# Patient Record
Sex: Male | Born: 1940 | Race: Asian | Hispanic: No | Marital: Married | Smoking: Current every day smoker
Health system: Southern US, Community
[De-identification: ages and names within clinical notes are randomized; demographics above are authoritative.]

## PROBLEM LIST (undated history)

## (undated) DIAGNOSIS — G8929 Other chronic pain: Secondary | ICD-10-CM

## (undated) DIAGNOSIS — D649 Anemia, unspecified: Secondary | ICD-10-CM

## (undated) DIAGNOSIS — M545 Low back pain, unspecified: Secondary | ICD-10-CM

## (undated) DIAGNOSIS — E785 Hyperlipidemia, unspecified: Secondary | ICD-10-CM

## (undated) DIAGNOSIS — R972 Elevated prostate specific antigen [PSA]: Secondary | ICD-10-CM

## (undated) HISTORY — DX: Low back pain, unspecified: M54.50

## (undated) HISTORY — DX: Elevated prostate specific antigen (PSA): R97.20

## (undated) HISTORY — PX: BACK SURGERY: SHX140

## (undated) HISTORY — DX: Anemia, unspecified: D64.9

## (undated) HISTORY — DX: Low back pain: M54.5

## (undated) HISTORY — DX: Hyperlipidemia, unspecified: E78.5

## (undated) HISTORY — DX: Other chronic pain: G89.29

---

## 2002-11-01 ENCOUNTER — Encounter: Payer: Self-pay | Admitting: Emergency Medicine

## 2002-11-01 ENCOUNTER — Emergency Department (HOSPITAL_COMMUNITY): Admission: EM | Admit: 2002-11-01 | Discharge: 2002-11-02 | Payer: Self-pay | Admitting: Emergency Medicine

## 2008-04-01 ENCOUNTER — Encounter: Admission: RE | Admit: 2008-04-01 | Discharge: 2008-04-01 | Payer: Self-pay | Admitting: Family Medicine

## 2008-04-17 ENCOUNTER — Encounter: Admission: RE | Admit: 2008-04-17 | Discharge: 2008-04-17 | Payer: Self-pay | Admitting: Family Medicine

## 2008-06-18 ENCOUNTER — Encounter: Admission: RE | Admit: 2008-06-18 | Discharge: 2008-06-18 | Payer: Self-pay | Admitting: Neurosurgery

## 2008-07-31 ENCOUNTER — Encounter: Admission: RE | Admit: 2008-07-31 | Discharge: 2008-07-31 | Payer: Self-pay | Admitting: Neurosurgery

## 2009-04-22 ENCOUNTER — Encounter: Admission: RE | Admit: 2009-04-22 | Discharge: 2009-04-22 | Payer: Self-pay | Admitting: Neurosurgery

## 2009-07-24 ENCOUNTER — Encounter: Admission: RE | Admit: 2009-07-24 | Discharge: 2009-07-24 | Payer: Self-pay | Admitting: Neurosurgery

## 2009-08-25 ENCOUNTER — Inpatient Hospital Stay (HOSPITAL_COMMUNITY): Admission: RE | Admit: 2009-08-25 | Discharge: 2009-08-26 | Payer: Self-pay | Admitting: Neurosurgery

## 2010-02-06 ENCOUNTER — Encounter: Payer: Self-pay | Admitting: Family Medicine

## 2010-04-01 LAB — URINALYSIS, ROUTINE W REFLEX MICROSCOPIC
Glucose, UA: NEGATIVE mg/dL
Ketones, ur: NEGATIVE mg/dL
Nitrite: NEGATIVE
Protein, ur: NEGATIVE mg/dL

## 2010-04-01 LAB — CBC
HCT: 35.7 % — ABNORMAL LOW (ref 39.0–52.0)
Hemoglobin: 12.3 g/dL — ABNORMAL LOW (ref 13.0–17.0)
MCHC: 34.5 g/dL (ref 30.0–36.0)

## 2010-04-01 LAB — PROTIME-INR
INR: 0.94 (ref 0.00–1.49)
Prothrombin Time: 12.8 seconds (ref 11.6–15.2)

## 2010-04-01 LAB — COMPREHENSIVE METABOLIC PANEL
ALT: 11 U/L (ref 0–53)
Alkaline Phosphatase: 71 U/L (ref 39–117)
CO2: 29 mEq/L (ref 19–32)
Calcium: 8.8 mg/dL (ref 8.4–10.5)
GFR calc non Af Amer: >60 mL/min (ref >60)
Glucose, Bld: 96 mg/dL (ref 70–99)
Sodium: 140 mEq/L (ref 135–145)

## 2010-04-01 LAB — DIFFERENTIAL
Basophils Absolute: 0 10*3/uL (ref 0.0–0.1)
Basophils Relative: 0 % (ref 0–1)
Eosinophils Absolute: 0.1 10*3/uL (ref 0.0–0.7)
Lymphs Abs: 2.2 10*3/uL (ref 0.7–4.0)
Neutrophils Relative %: 64 % (ref 43–77)

## 2010-04-01 LAB — SURGICAL PCR SCREEN: MRSA, PCR: NEGATIVE

## 2010-05-12 ENCOUNTER — Other Ambulatory Visit: Payer: Self-pay | Admitting: Family Medicine

## 2010-05-12 DIAGNOSIS — R300 Dysuria: Secondary | ICD-10-CM

## 2010-05-19 ENCOUNTER — Other Ambulatory Visit: Payer: Self-pay

## 2011-01-16 ENCOUNTER — Ambulatory Visit
Admission: RE | Admit: 2011-01-16 | Discharge: 2011-01-16 | Disposition: A | Discharge status: Home / Self Care | Payer: Medicare Other | Source: Ambulatory Visit | Attending: Family Medicine | Admitting: Family Medicine

## 2011-01-16 DIAGNOSIS — R300 Dysuria: Secondary | ICD-10-CM

## 2014-05-14 ENCOUNTER — Ambulatory Visit
Admission: RE | Admit: 2014-05-14 | Discharge: 2014-05-14 | Disposition: A | Discharge status: Home / Self Care | Payer: Self-pay | Source: Ambulatory Visit | Attending: Family Medicine | Admitting: Family Medicine

## 2014-05-14 ENCOUNTER — Other Ambulatory Visit: Payer: Self-pay | Admitting: Family Medicine

## 2014-05-14 DIAGNOSIS — M545 Low back pain: Secondary | ICD-10-CM

## 2014-05-14 DIAGNOSIS — M25511 Pain in right shoulder: Secondary | ICD-10-CM

## 2014-05-23 ENCOUNTER — Emergency Department (HOSPITAL_COMMUNITY)
Admission: EM | Admit: 2014-05-23 | Discharge: 2014-05-24 | Disposition: A | Discharge status: Home / Self Care | Payer: Medicare Other | Attending: Emergency Medicine | Admitting: Emergency Medicine

## 2014-05-23 ENCOUNTER — Encounter (HOSPITAL_COMMUNITY): Payer: Self-pay | Admitting: Emergency Medicine

## 2014-05-23 DIAGNOSIS — Y929 Unspecified place or not applicable: Secondary | ICD-10-CM | POA: Insufficient documentation

## 2014-05-23 DIAGNOSIS — Y999 Unspecified external cause status: Secondary | ICD-10-CM | POA: Diagnosis not present

## 2014-05-23 DIAGNOSIS — Z9889 Other specified postprocedural states: Secondary | ICD-10-CM | POA: Insufficient documentation

## 2014-05-23 DIAGNOSIS — Y939 Activity, unspecified: Secondary | ICD-10-CM | POA: Diagnosis not present

## 2014-05-23 DIAGNOSIS — Z79899 Other long term (current) drug therapy: Secondary | ICD-10-CM | POA: Diagnosis not present

## 2014-05-23 DIAGNOSIS — S39012A Strain of muscle, fascia and tendon of lower back, initial encounter: Secondary | ICD-10-CM | POA: Diagnosis not present

## 2014-05-23 DIAGNOSIS — Z720 Tobacco use: Secondary | ICD-10-CM | POA: Diagnosis not present

## 2014-05-23 DIAGNOSIS — X58XXXA Exposure to other specified factors, initial encounter: Secondary | ICD-10-CM | POA: Diagnosis not present

## 2014-05-23 DIAGNOSIS — R32 Unspecified urinary incontinence: Secondary | ICD-10-CM | POA: Diagnosis not present

## 2014-05-23 DIAGNOSIS — M545 Low back pain: Secondary | ICD-10-CM | POA: Diagnosis present

## 2014-05-23 DIAGNOSIS — G8929 Other chronic pain: Secondary | ICD-10-CM | POA: Insufficient documentation

## 2014-05-23 NOTE — ED Notes (Signed)
Ice pack and heat pack offered while he waits

## 2014-05-23 NOTE — ED Notes (Signed)
Pt from home c/o chronic back pain that has progressively gotten worse. He has been taking tylenol without relief.

## 2014-05-24 ENCOUNTER — Emergency Department (HOSPITAL_COMMUNITY): Payer: Medicare Other

## 2014-05-24 LAB — BASIC METABOLIC PANEL
Anion gap: 5 (ref 5–15)
BUN: 13 mg/dL (ref 6–20)
CO2: 26 mmol/L (ref 22–32)
CREATININE: 0.8 mg/dL (ref 0.61–1.24)
Calcium: 8.8 mg/dL — ABNORMAL LOW (ref 8.9–10.3)
Chloride: 109 mmol/L (ref 101–111)
GLUCOSE: 85 mg/dL (ref 70–99)
Potassium: 4.1 mmol/L (ref 3.5–5.1)
Sodium: 140 mmol/L (ref 135–145)

## 2014-05-24 LAB — CBC WITH DIFFERENTIAL/PLATELET
Basophils Absolute: 0 10*3/uL (ref 0.0–0.1)
Basophils Relative: 0 % (ref 0–1)
EOS ABS: 0.2 10*3/uL (ref 0.0–0.7)
EOS PCT: 3 % (ref 0–5)
HCT: 34.5 % — ABNORMAL LOW (ref 39.0–52.0)
Hemoglobin: 11.7 g/dL — ABNORMAL LOW (ref 13.0–17.0)
LYMPHS ABS: 2.7 10*3/uL (ref 0.7–4.0)
Lymphocytes Relative: 39 % (ref 12–46)
MCH: 31 pg (ref 26.0–34.0)
MCHC: 33.9 g/dL (ref 30.0–36.0)
MCV: 91.3 fL (ref 78.0–100.0)
MONOS PCT: 7 % (ref 3–12)
Monocytes Absolute: 0.5 10*3/uL (ref 0.1–1.0)
Neutro Abs: 3.4 10*3/uL (ref 1.7–7.7)
Neutrophils Relative %: 51 % (ref 43–77)
PLATELETS: 173 10*3/uL (ref 150–400)
RBC: 3.78 MIL/uL — AB (ref 4.22–5.81)
RDW: 12.2 % (ref 11.5–15.5)
WBC: 6.8 10*3/uL (ref 4.0–10.5)

## 2014-05-24 LAB — URINALYSIS, ROUTINE W REFLEX MICROSCOPIC
Bilirubin Urine: NEGATIVE
GLUCOSE, UA: NEGATIVE mg/dL
KETONES UR: NEGATIVE mg/dL
Leukocytes, UA: NEGATIVE
NITRITE: NEGATIVE
PROTEIN: NEGATIVE mg/dL
Specific Gravity, Urine: 1.011 (ref 1.005–1.030)
UROBILINOGEN UA: 0.2 mg/dL (ref 0.0–1.0)
pH: 6.5 (ref 5.0–8.0)

## 2014-05-24 LAB — URINE MICROSCOPIC-ADD ON

## 2014-05-24 MED ORDER — KETOROLAC TROMETHAMINE 60 MG/2ML IM SOLN
30.0000 mg | Freq: Once | INTRAMUSCULAR | Status: AC
  Administered 2014-05-24: 30 mg via INTRAMUSCULAR
  Filled 2014-05-24: qty 2

## 2014-05-24 MED ORDER — IOHEXOL 300 MG/ML  SOLN
50.0000 mL | Freq: Once | INTRAMUSCULAR | Status: AC | PRN
  Administered 2014-05-24: 50 mL via ORAL

## 2014-05-24 NOTE — ED Notes (Signed)
Patient transported to CT 

## 2014-05-24 NOTE — ED Provider Notes (Signed)
CSN: [id number]     Arrival date & time 05/23/14  2222 History   First MD Initiated Contact with Patient 05/24/14 0121     Chief Complaint  Patient presents with  . Back Pain     (Consider location/radiation/quality/duration/timing/severity/associated sxs/prior Treatment) Patient is a 74 y.o. male presenting with back pain.  Back Pain Location:  Lumbar spine Quality:  Aching Radiates to: bil le. Pain severity:  Moderate Pain is:  Same all the time Onset quality:  Gradual Duration:  2 weeks Timing:  Constant Progression:  Worsening Chronicity:  Chronic Context: not falling, not MVA and not recent injury   Relieved by:  Nothing Worsened by:  Movement, twisting, standing, palpation and sitting Associated symptoms: bladder incontinence (chronically, negative MRI after onset of symptoms), leg pain, paresthesias (bil feet) and tingling   Associated symptoms: no abdominal pain, no bowel incontinence, no dysuria, no fever and no perianal numbness     History reviewed. No pertinent past medical history. Past Surgical History  Procedure Laterality Date  . Back surgery     No family history on file. History  Substance Use Topics  . Smoking status: Current Every Day Smoker  . Smokeless tobacco: Not on file  . Alcohol Use: No    Review of Systems  Constitutional: Negative for fever.  Gastrointestinal: Negative for abdominal pain and bowel incontinence.  Genitourinary: Positive for bladder incontinence (chronically, negative MRI after onset of symptoms). Negative for dysuria.  Musculoskeletal: Positive for back pain.  Neurological: Positive for tingling and paresthesias (bil feet).  All other systems reviewed and are negative.     Allergies  Review of patient's allergies indicates no known allergies.  Home Medications   Prior to Admission medications   Medication Sig Start Date End Date Taking? Authorizing Provider  acetaminophen (TYLENOL) 650 MG CR tablet Take 1,300 mg  by mouth every 8 (eight) hours as needed for pain.   Yes Historical Provider, MD  COMBIGAN 0.2-0.5 % ophthalmic solution Place 1 drop into both eyes daily.  05/05/14  Yes Historical Provider, MD  LOTEMAX 0.5 % ophthalmic suspension Place 1 drop into both eyes daily.  05/05/14  Yes Historical Provider, MD  lovastatin (MEVACOR) 20 MG tablet Take 20 mg by mouth daily at 6 PM.  04/06/14  Yes Historical Provider, MD   BP 129/72 mmHg  Pulse 62  Temp(Src) 97.9 F (36.6 C) (Oral)  Resp 16  SpO2 96% Physical Exam  Constitutional: He is oriented to person, place, and time. He appears well-developed and well-nourished.  HENT:  Head: Normocephalic and atraumatic.  Eyes: Conjunctivae and EOM are normal.  Neck: Normal range of motion. Neck supple.  Cardiovascular: Normal rate, regular rhythm and normal heart sounds.   Pulmonary/Chest: Effort normal and breath sounds normal. No respiratory distress.  Abdominal: He exhibits no distension. There is no tenderness. There is no rebound and no guarding.  Musculoskeletal: Normal range of motion.  Neurological: He is alert and oriented to person, place, and time. He exhibits normal muscle tone.  Neuro intact distal to umbilicus  Skin: Skin is warm and dry.  Vitals reviewed.   ED Course  Procedures (including critical care time) Labs Review Labs Reviewed  URINALYSIS, ROUTINE W REFLEX MICROSCOPIC - Abnormal; Notable for the following:    Hgb urine dipstick TRACE (*)    All other components within normal limits  BASIC METABOLIC PANEL - Abnormal; Notable for the following:    Calcium 8.8 (*)    All other components within  normal limits  CBC WITH DIFFERENTIAL/PLATELET - Abnormal; Notable for the following:    RBC 3.78 (*)    Hemoglobin 11.7 (*)    HCT 34.5 (*)    All other components within normal limits  URINE MICROSCOPIC-ADD ON    Imaging Review Ct Abdomen Pelvis Wo Contrast  05/24/2014   CLINICAL DATA:  Chronic back pain, progressively worsened.   EXAM: CT ABDOMEN AND PELVIS WITHOUT CONTRAST  TECHNIQUE: Multidetector CT imaging of the abdomen and pelvis was performed following the standard protocol without IV contrast.  COMPARISON:  None.  FINDINGS: There are unremarkable unenhanced appearances of the liver, gallbladder and bile ducts. The pancreas appears normal. The spleen appears normal. The adrenals appear normal.  Kidneys are remarkable only for simple cysts bilaterally, also seen on the prior ultrasound of 01/16/2011. There is no hydronephrosis or ureteral dilatation. There are no urinary calculi. Urinary bladder appears unremarkable.  Bowel appears unremarkable except for mild uncomplicated colonic diverticulosis.  The abdominal aorta is normal in caliber with mild atherosclerotic calcification.  No acute inflammatory changes are evident in the abdomen or pelvis. There is no ascites. There is no adenopathy.  There is no significant abnormality in the lower chest.  There is moderate degenerative disc change, particularly on the right at L4-5. 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 No significant skeletal lesions are evident.  IMPRESSION: No acute findings are evident in the abdomen or pelvis. No significant abnormality. Incidentally noted lumbar degenerative disc and facet changes, greatest at L4-5 on the right.   Electronically Signed   By: Ellery Plunkaniel R Mitchell M.D.   On: 05/24/2014 03:59     EKG Interpretation None      MDM   Final diagnoses:  Lumbar strain, initial encounter    74 y.o. male with pertinent PMH of chronic back pain with prior back surgery presents with acute on chronic back pain as above. No systemic symptoms. The patient does have some concerning historical elements for cauda equina, however has had unremarkable workup including MRI after onset of the symptoms and they have been chronic for months. On arrival today the patient has vital signs and physical exam as above. He has no neurologic  deficits evident on physical exam. He denies dysuria, fever and pain is exacerbated by movement.  Wu with trace hematuria, obtained CT scan which was unremarkable.  Will have pt fu with PCP.  Likely msk strain.  DC home in stable condition  I have reviewed all laboratory and imaging studies if ordered as above  1. Lumbar strain, initial encounter         Mirian MoMatthew Klyde Banka, MD 05/24/14 0630

## 2014-05-24 NOTE — Discharge Instructions (Signed)
Back Pain, Adult Low back pain is very common. About 1 in 5 people have back pain.The cause of low back pain is rarely dangerous. The pain often gets better over time.About half of people with a sudden onset of back pain feel better in just 2 weeks. About 8 in 10 people feel better by 6 weeks.  CAUSES Some common causes of back pain include:  Strain of the muscles or ligaments supporting the spine.  Wear and tear (degeneration) of the spinal discs.  Arthritis.  Direct injury to the back. DIAGNOSIS Most of the time, the direct cause of low back pain is not known.However, back pain can be treated effectively even when the exact cause of the pain is unknown.Answering your caregiver's questions about your overall health and symptoms is one of the most accurate ways to make sure the cause of your pain is not dangerous. If your caregiver needs more information, he or she may order lab work or imaging tests (X-rays or MRIs).However, even if imaging tests show changes in your back, this usually does not require surgery. HOME CARE INSTRUCTIONS For many people, back pain returns.Since low back pain is rarely dangerous, it is often a condition that people can learn to manageon their own.   Remain active. It is stressful on the back to sit or stand in one place. Do not sit, drive, or stand in one place for more than 30 minutes at a time. Take short walks on level surfaces as soon as pain allows.Try to increase the length of time you walk each day.  Do not stay in bed.Resting more than 1 or 2 days can delay your recovery.  Do not avoid exercise or work.Your body is made to move.It is not dangerous to be active, even though your back may hurt.Your back will likely heal faster if you return to being active before your pain is gone.  Pay attention to your body when you bend and lift. Many people have less discomfortwhen lifting if they bend their knees, keep the load close to their bodies,and  avoid twisting. Often, the most comfortable positions are those that put less stress on your recovering back.  Find a comfortable position to sleep. Use a firm mattress and lie on your side with your knees slightly bent. If you lie on your back, put a pillow under your knees.  Only take over-the-counter or prescription medicines as directed by your caregiver. Over-the-counter medicines to reduce pain and inflammation are often the most helpful.Your caregiver may prescribe muscle relaxant drugs.These medicines help dull your pain so you can more quickly return to your normal activities and healthy exercise.  Put ice on the injured area.  Put ice in a plastic bag.  Place a towel between your skin and the bag.  Leave the ice on for 15-20 minutes, 03-04 times a day for the first 2 to 3 days. After that, ice and heat may be alternated to reduce pain and spasms.  Ask your caregiver about trying back exercises and gentle massage. This may be of some benefit.  Avoid feeling anxious or stressed.Stress increases muscle tension and can worsen back pain.It is important to recognize when you are anxious or stressed and learn ways to manage it.Exercise is a great option. SEEK MEDICAL CARE IF:  You have pain that is not relieved with rest or medicine.  You have pain that does not improve in 1 week.  You have new symptoms.  You are generally not feeling well. SEEK   IMMEDIATE MEDICAL CARE IF:   You have pain that radiates from your back into your legs.  You develop new bowel or bladder control problems.  You have unusual weakness or numbness in your arms or legs.  You develop nausea or vomiting.  You develop abdominal pain.  You feel faint. Document Released: 01/02/2005 Document Revised: 07/04/2011 Document Reviewed: 05/06/2013 Exit[employer] Patient Information 2015 [employer]. This information is not intended to replace advice given to you by your health care provider. Make sure you  discuss any questions you have with your health care provider.  

## 2014-06-24 ENCOUNTER — Other Ambulatory Visit: Payer: Self-pay | Admitting: Physical Medicine and Rehabilitation

## 2014-06-24 DIAGNOSIS — M545 Low back pain: Secondary | ICD-10-CM

## 2014-06-25 ENCOUNTER — Ambulatory Visit
Admission: RE | Admit: 2014-06-25 | Discharge: 2014-06-25 | Disposition: A | Discharge status: Home / Self Care | Payer: Medicare Other | Source: Ambulatory Visit | Attending: Physical Medicine and Rehabilitation | Admitting: Physical Medicine and Rehabilitation

## 2014-06-25 DIAGNOSIS — M545 Low back pain: Secondary | ICD-10-CM

## 2014-06-25 MED ORDER — METHYLPREDNISOLONE ACETATE 40 MG/ML INJ SUSP (RADIOLOG
120.0000 mg | Freq: Once | INTRAMUSCULAR | Status: AC
  Administered 2014-06-25: 120 mg via EPIDURAL

## 2014-06-25 MED ORDER — IOHEXOL 180 MG/ML  SOLN
1.0000 mL | Freq: Once | INTRAMUSCULAR | Status: AC | PRN
  Administered 2014-06-25: 1 mL via EPIDURAL

## 2014-06-25 NOTE — Discharge Instructions (Signed)

## 2014-10-26 ENCOUNTER — Telehealth: Payer: Self-pay | Admitting: Hematology

## 2014-10-26 NOTE — Telephone Encounter (Signed)
New patient appt-s/w patient dtr Mark Klein and gave np appt for 10/19 @ 10:45 w/Dr. Candise Che.  Referring Dr. Windle Guard Dx- Abnormal CBC  Referral information scanned

## 2014-11-04 ENCOUNTER — Ambulatory Visit: Payer: Medicare Other | Admitting: Hematology

## 2014-11-04 ENCOUNTER — Other Ambulatory Visit: Payer: Medicare Other

## 2015-06-24 IMAGING — CT CT ABD-PELV W/O CM
2 of 4 series · 16 of 46 positions shown, 18 images · non-contrast
Comparison: None.

CLINICAL DATA: Chronic back pain, progressively worsened.

EXAM:
CT ABDOMEN AND PELVIS WITHOUT CONTRAST
TECHNIQUE: Multidetector CT imaging of the abdomen and pelvis was performed
following the standard protocol without IV contrast.

[Series 2: abd/pel w/o · axial · non-contrast · 0.66mm/px · z∈[-466,-71]mm · 13 of 87 slices shown, 15 images]
[im 4/87  soft-tissue]
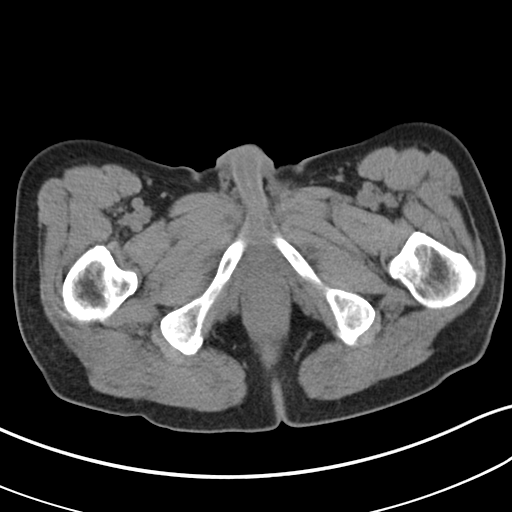
[im 4/87  bone]
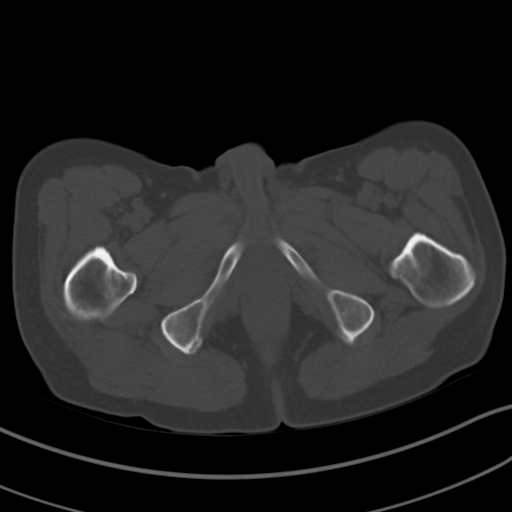
[im 12/87  soft-tissue]
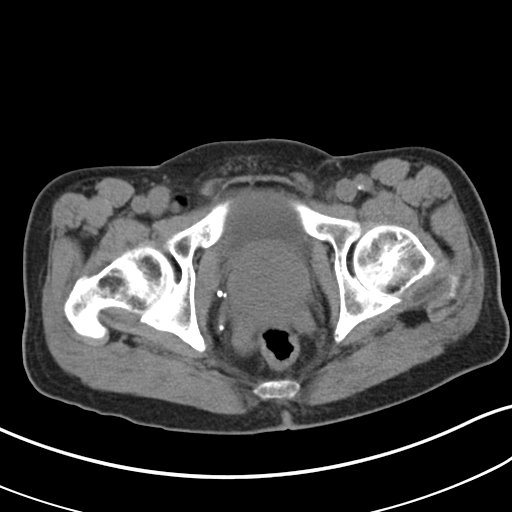
[im 19/87  soft-tissue]
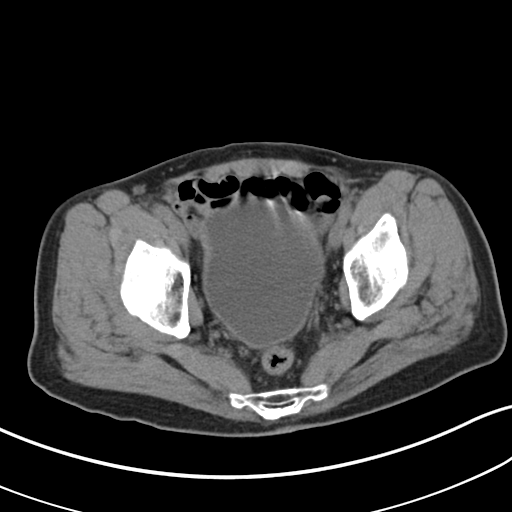
[im 23/87  soft-tissue]
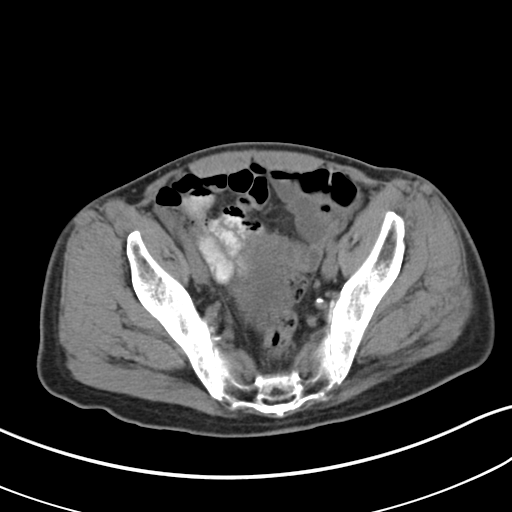
[im 30/87  soft-tissue]
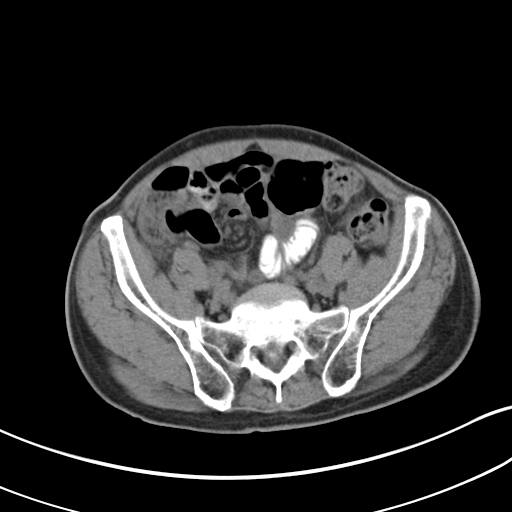
[im 38/87  soft-tissue]
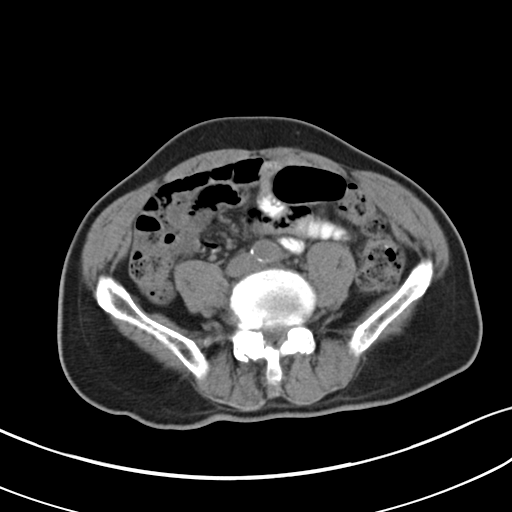
[im 45/87  soft-tissue]
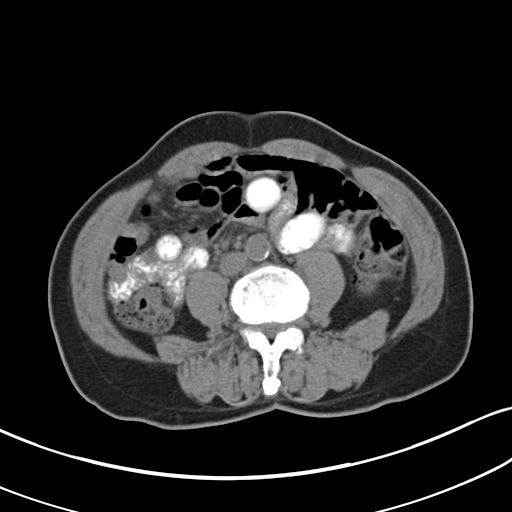
[im 49/87  soft-tissue]
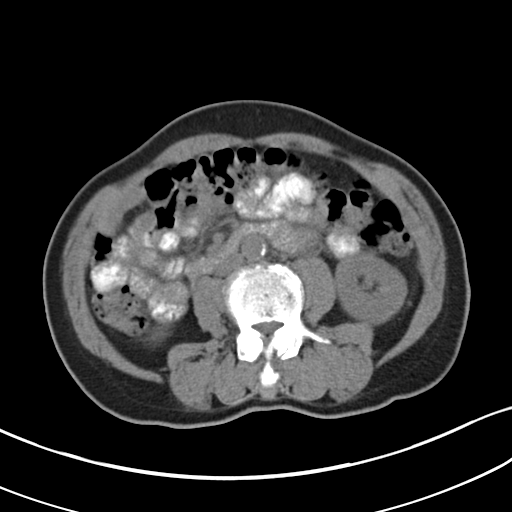
[im 57/87  soft-tissue]
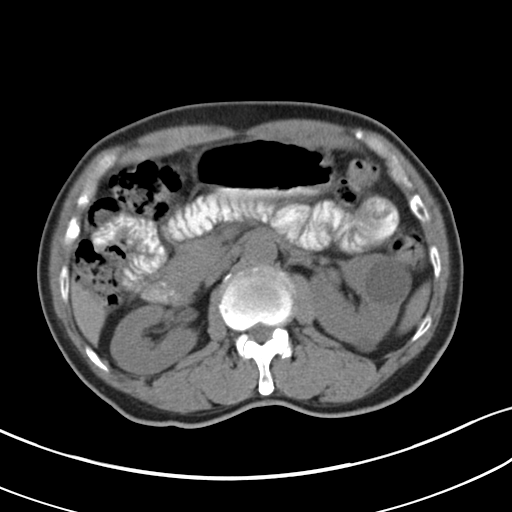
[im 57/87  bone]
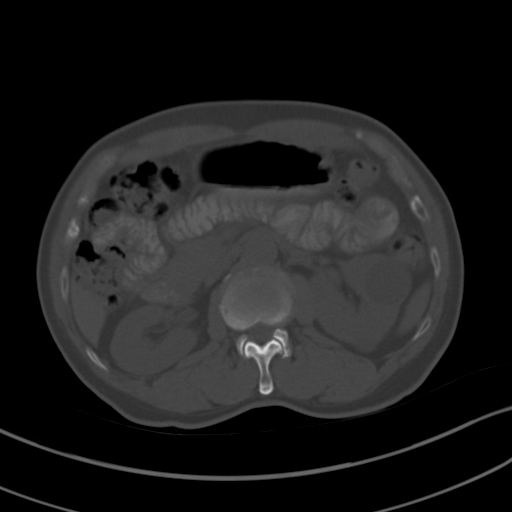
[im 64/87  soft-tissue]
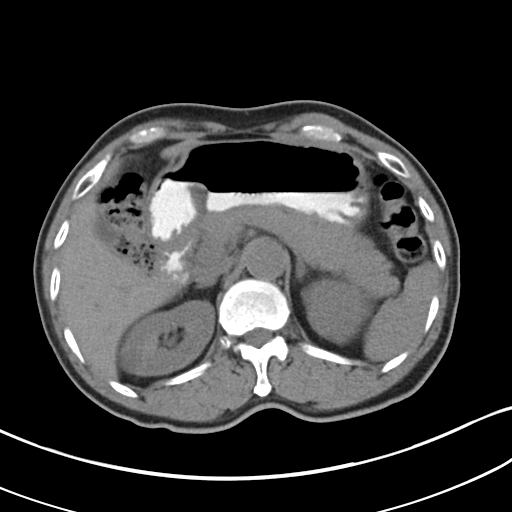
[im 68/87  soft-tissue]
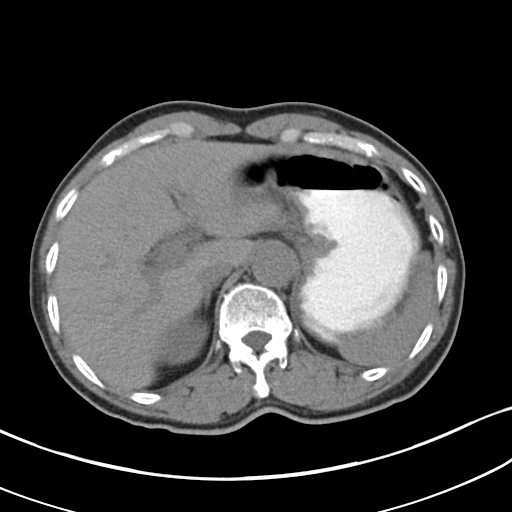
[im 75/87  soft-tissue]
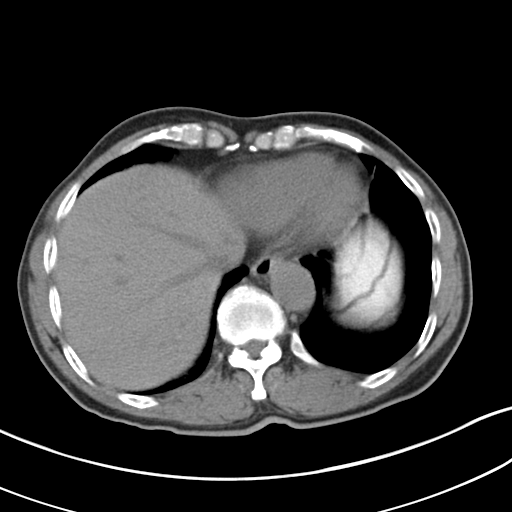
[im 83/87  soft-tissue]
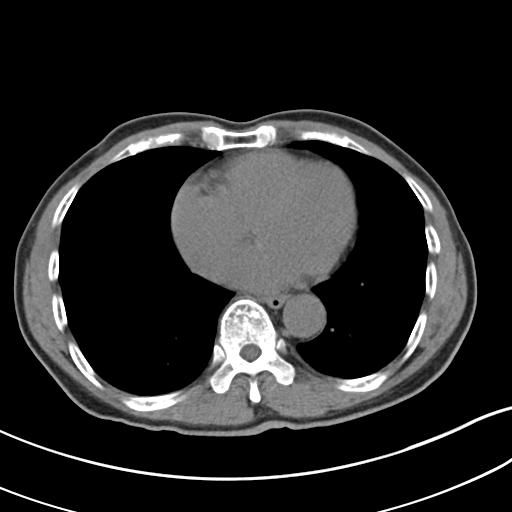

[Series 5: coronal · coronal · 0.67mm/px · 3 of 74 slices shown]
[im 25/74  soft-tissue]
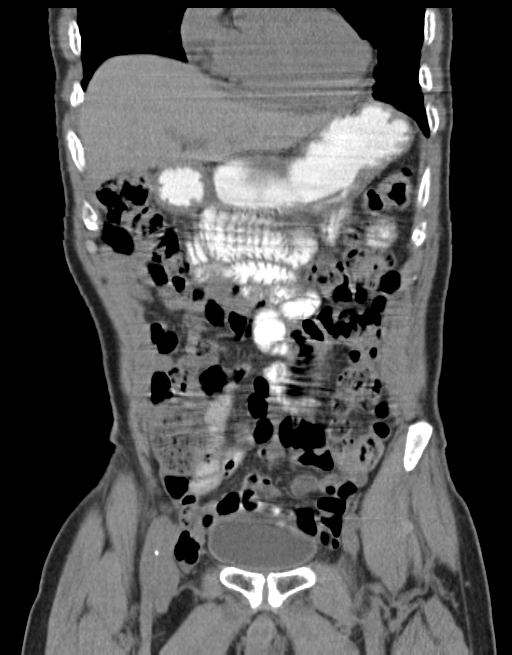
[im 33/74  soft-tissue]
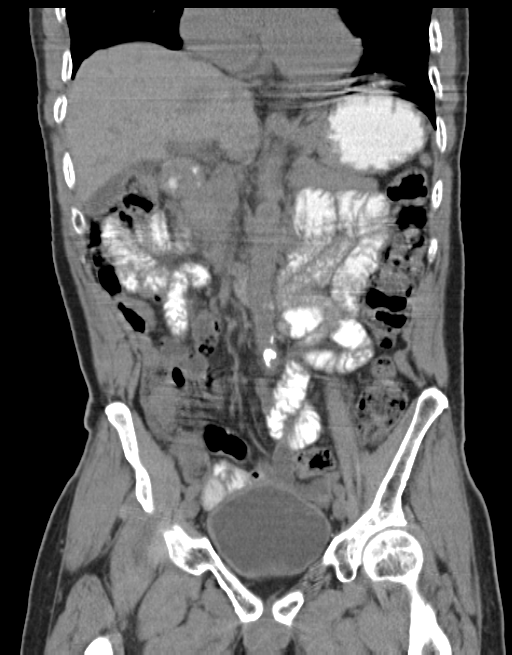
[im 41/74  soft-tissue]
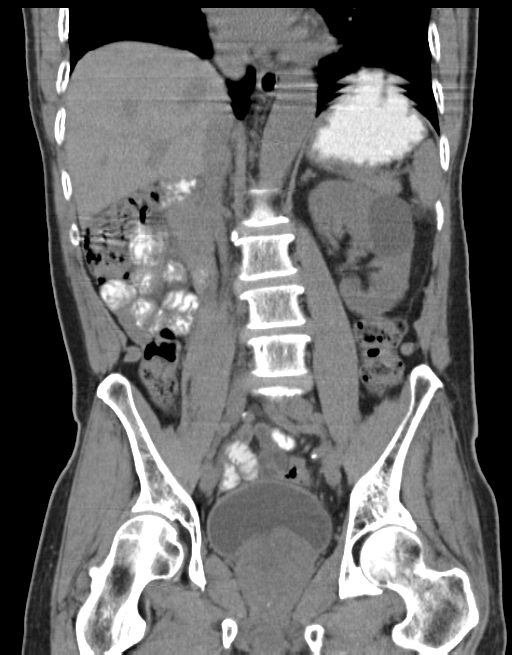

[16 of 46 positions shown; findings below may reference images not displayed]

FINDINGS: There are unremarkable unenhanced appearances of the liver,
gallbladder and bile ducts. The pancreas appears normal. The spleen
appears normal. The adrenals appear normal.

Kidneys are remarkable only for simple cysts bilaterally, also seen
on the prior ultrasound of 01/16/2011. There is no hydronephrosis or
ureteral dilatation. There are no urinary calculi. Urinary bladder
appears unremarkable.

Bowel appears unremarkable except for mild uncomplicated colonic
diverticulosis.

The abdominal aorta is normal in caliber with mild atherosclerotic
calcification.

No acute inflammatory changes are evident in the abdomen or pelvis.
There is no ascites. There is no adenopathy.

There is no significant abnormality in the lower chest.

There is moderate degenerative disc change, particularly on the
right at L4-5. There is moderately severe facet arthritis on the
right at L4-5. No significant skeletal lesions are evident.
IMPRESSION: No acute findings are evident in the abdomen or pelvis. No
significant abnormality. Incidentally noted lumbar degenerative disc
and facet changes, greatest at L4-5 on the right.

## 2015-09-08 ENCOUNTER — Encounter: Payer: Self-pay | Admitting: Hematology

## 2015-09-23 ENCOUNTER — Ambulatory Visit: Payer: Medicare Other | Admitting: Hematology

## 2017-04-11 ENCOUNTER — Encounter: Payer: Self-pay | Admitting: Gastroenterology

## 2017-05-15 ENCOUNTER — Other Ambulatory Visit: Payer: Self-pay

## 2017-05-23 ENCOUNTER — Encounter: Payer: Self-pay | Admitting: Gastroenterology

## 2017-05-23 ENCOUNTER — Ambulatory Visit (INDEPENDENT_AMBULATORY_CARE_PROVIDER_SITE_OTHER): Payer: Medicare Other | Admitting: Gastroenterology

## 2017-05-23 VITALS — BP 108/66 | HR 64 | Ht 66.5 in | Wt 120.1 lb

## 2017-05-23 DIAGNOSIS — R195 Other fecal abnormalities: Secondary | ICD-10-CM | POA: Diagnosis not present

## 2017-05-23 MED ORDER — PEG 3350-KCL-NA BICARB-NACL 420 G PO SOLR: 4000.0000 mL | ORAL | 0 refills | Status: AC

## 2017-05-23 NOTE — Progress Notes (Signed)
HPI: This is a very pleasant Vietnamese speaking only 77 year old man who was referred by Dr. Delbert Harness for Hemoccult positive stool, mild anemia  He is here with his daughter today who acts as a Therapist, art complaint is Hemoccult positive stool  "mild normocytic anemia with normal iron studies" per recent note by PCP 2 months ago.  The hemoglobin was 12.1, the MCV and iron studies were normal.  FOB +   He does not see blood in his stool except once a year or so around the time of pushing and straining.  He has never had a colonoscopy.  Family history is negative for colon cancer that he is aware of.  2-3 years of mild constipation that is improved on a medicine that he forgot.  oveall stable weight.  Old Data Reviewed:    Review of systems: Pertinent positive and negative review of systems were noted in the above HPI section. All other review negative.   Past Medical History:  Diagnosis Date  . Anemia   . Chronic low back pain   . Elevated PSA   . Hyperlipemia     Past Surgical History:  Procedure Laterality Date  . BACK SURGERY      Current Outpatient Medications  Medication Sig Dispense Refill  . acetaminophen (TYLENOL) 650 MG CR tablet Take 1,300 mg by mouth every 8 (eight) hours as needed for pain.    . COMBIGAN 0.2-0.5 % ophthalmic solution Place 1 drop into both eyes daily.   6  . LOTEMAX 0.5 % ophthalmic suspension Place 1 drop into both eyes daily.   0  . lovastatin (MEVACOR) 20 MG tablet Take 20 mg by mouth daily at 6 PM.   3   No current facility-administered medications for this visit.     Allergies as of 05/23/2017  . (No Known Allergies)    History reviewed. No pertinent family history.  Social History   Socioeconomic History  . Marital status: Married    Spouse name: Not on file  . Number of children: Not on file  . Years of education: Not on file  . Highest education level: Not on file  Occupational History  . Not on file  Social  Needs  . Financial resource strain: Not on file  . Food insecurity:    Worry: Not on file    Inability: Not on file  . Transportation needs:    Medical: Not on file    Non-medical: Not on file  Tobacco Use  . Smoking status: Current Every Day Smoker    Packs/day: 0.60  . Smokeless tobacco: Never Used  Substance and Sexual Activity  . Alcohol use: No  . Drug use: Never  . Sexual activity: Not on file  Lifestyle  . Physical activity:    Days per week: Not on file    Minutes per session: Not on file  . Stress: Not on file  Relationships  . Social connections:    Talks on phone: Not on file    Gets together: Not on file    Attends religious service: Not on file    Active member of club or organization: Not on file    Attends meetings of clubs or organizations: Not on file    Relationship status: Not on file  . Intimate partner violence:    Fear of current or ex partner: Not on file    Emotionally abused: Not on file    Physically abused: Not on file  Forced sexual activity: Not on file  Other Topics Concern  . Not on file  Social History Narrative  . Not on file     Physical Exam: BP 108/66   Pulse 64   Ht 5' 6.5" (1.689 m)   Wt 120 lb 2 oz (54.5 kg)   BMI 19.10 kg/m  Constitutional: generally well-appearing Psychiatric: alert and oriented x3 Eyes: extraocular movements intact Mouth: oral pharynx moist, no lesions Neck: supple no lymphadenopathy Cardiovascular: heart regular rate and rhythm Lungs: clear to auscultation bilaterally Abdomen: soft, nontender, nondistended, no obvious ascites, no peritoneal signs, normal bowel sounds Extremities: no lower extremity edema bilaterally Skin: no lesions on visible extremities   Assessment and plan: 77 y.o. male with Hemoccult positive stool, very mild anemia  He is not iron deficient and his MCV is normal.  He does have a very mild anemia with a hemoglobin of 12.1.  Hemoccult testing was positive by his primary  care physician and I recommended a colonoscopy for further evaluation.  We will need a [place] translator for that visit.  I see no reason for any further blood tests or imaging studies prior to then.    Please see the "Patient Instructions" section for addition details about the plan.   Rob Bunting, MD [facility] 05/23/2017, 9:03 AM  Cc:

## 2017-05-23 NOTE — Patient Instructions (Signed)
You will be set up for a colonoscopy for FOBT + stool, mild anemia. [place] translator will be needed for that visit.

## 2017-07-03 ENCOUNTER — Encounter: Payer: Medicare Other | Admitting: Gastroenterology

## 2018-02-27 ENCOUNTER — Emergency Department (HOSPITAL_COMMUNITY)
Admission: EM | Admit: 2018-02-27 | Discharge: 2018-02-28 | Disposition: A | Discharge status: Home / Self Care | Payer: Medicare Other | Attending: Emergency Medicine | Admitting: Emergency Medicine

## 2018-02-27 DIAGNOSIS — Z79899 Other long term (current) drug therapy: Secondary | ICD-10-CM | POA: Diagnosis not present

## 2018-02-27 DIAGNOSIS — F17210 Nicotine dependence, cigarettes, uncomplicated: Secondary | ICD-10-CM | POA: Insufficient documentation

## 2018-02-27 DIAGNOSIS — R339 Retention of urine, unspecified: Secondary | ICD-10-CM

## 2018-02-27 NOTE — ED Triage Notes (Signed)
Per family pt unable to urinate since earlier today. States this has happened 1 time in the past. Attempting to bladder scan but bladder scan not working.

## 2018-02-27 NOTE — ED Notes (Signed)
Bladder scan shows >550mL

## 2018-02-28 ENCOUNTER — Encounter (HOSPITAL_COMMUNITY): Payer: Self-pay | Admitting: Emergency Medicine

## 2018-02-28 DIAGNOSIS — R339 Retention of urine, unspecified: Secondary | ICD-10-CM | POA: Diagnosis not present

## 2018-02-28 LAB — URINALYSIS, ROUTINE W REFLEX MICROSCOPIC
Bacteria, UA: NONE SEEN
Bilirubin Urine: NEGATIVE
Glucose, UA: NEGATIVE mg/dL
Ketones, ur: NEGATIVE mg/dL
Leukocytes,Ua: NEGATIVE
Nitrite: NEGATIVE
PH: 8 (ref 5.0–8.0)
Protein, ur: NEGATIVE mg/dL
SPECIFIC GRAVITY, URINE: 1.005 (ref 1.005–1.030)

## 2018-02-28 LAB — POCT I-STAT EG7
Acid-Base Excess: 1 mmol/L (ref 0.0–2.0)
Bicarbonate: 26.1 mmol/L (ref 20.0–28.0)
Calcium, Ion: 1.14 mmol/L — ABNORMAL LOW (ref 1.15–1.40)
HCT: 35 % — ABNORMAL LOW (ref 39.0–52.0)
Hemoglobin: 11.9 g/dL — ABNORMAL LOW (ref 13.0–17.0)
O2 Saturation: 85 %
POTASSIUM: 4.1 mmol/L (ref 3.5–5.1)
Sodium: 139 mmol/L (ref 135–145)
TCO2: 27 mmol/L (ref 22–32)
pCO2, Ven: 41.4 mmHg — ABNORMAL LOW (ref 44.0–60.0)
pH, Ven: 7.408 (ref 7.250–7.430)
pO2, Ven: 49 mmHg — ABNORMAL HIGH (ref 32.0–45.0)

## 2018-02-28 LAB — CBC WITH DIFFERENTIAL/PLATELET
Abs Immature Granulocytes: 0.01 10*3/uL (ref 0.00–0.07)
BASOS ABS: 0 10*3/uL (ref 0.0–0.1)
Basophils Relative: 0 %
EOS PCT: 2 %
Eosinophils Absolute: 0.1 10*3/uL (ref 0.0–0.5)
HCT: 35.9 % — ABNORMAL LOW (ref 39.0–52.0)
Hemoglobin: 11.9 g/dL — ABNORMAL LOW (ref 13.0–17.0)
IMMATURE GRANULOCYTES: 0 %
LYMPHS ABS: 1.7 10*3/uL (ref 0.7–4.0)
LYMPHS PCT: 24 %
MCH: 31 pg (ref 26.0–34.0)
MCHC: 33.1 g/dL (ref 30.0–36.0)
MCV: 93.5 fL (ref 80.0–100.0)
MONO ABS: 0.6 10*3/uL (ref 0.1–1.0)
Monocytes Relative: 8 %
NEUTROS ABS: 4.6 10*3/uL (ref 1.7–7.7)
Neutrophils Relative %: 66 %
Platelets: 189 10*3/uL (ref 150–400)
RBC: 3.84 MIL/uL — AB (ref 4.22–5.81)
RDW: 12.3 % (ref 11.5–15.5)
WBC: 7 10*3/uL (ref 4.0–10.5)
nRBC: 0 % (ref 0.0–0.2)

## 2018-02-28 LAB — I-STAT CREATININE, ED: Creatinine, Ser: 0.8 mg/dL (ref 0.61–1.24)

## 2018-02-28 LAB — CBG MONITORING, ED: Glucose-Capillary: 95 mg/dL (ref 70–99)

## 2018-02-28 MED ORDER — TAMSULOSIN HCL 0.4 MG PO CAPS: 0.4000 mg | ORAL_CAPSULE | Freq: Every day | ORAL | 0 refills | Status: AC

## 2018-02-28 NOTE — ED Notes (Signed)
Pt's catheter drainage bag switched over to leg bag.

## 2018-02-28 NOTE — ED Notes (Signed)
Patient verbalizes understanding of discharge instructions. Opportunity for questioning and answers were provided. Armband removed by staff, pt discharged from ED in wheelchair.  

## 2018-02-28 NOTE — ED Provider Notes (Signed)
[facility] EMERGENCY DEPARTMENT Provider Note   CSN: [id number] Arrival date & time: 02/27/18  2144     History   Chief Complaint Chief Complaint  Patient presents with  . Urinary Retention    HPI Mark Klein is a 78 y.o. male.  The history is provided by the patient and a relative.  Illness  Location:  Bladder Quality:  Aching and distended Severity:  Severe Onset quality:  Gradual Duration:  2 days Timing:  Constant Progression:  Worsening Chronicity:  New Context:  Enlarged prostate Relieved by:  Nothing Worsened by:  Nothing Ineffective treatments:  None Associated symptoms: no congestion, no cough, no fever, no shortness of breath, no vomiting and no wheezing   Associated symptoms comment:  Abdominal distention   Past Medical History:  Diagnosis Date  . Anemia   . Chronic low back pain   . Elevated PSA   . Hyperlipemia     There are no active problems to display for this patient.   Past Surgical History:  Procedure Laterality Date  . BACK SURGERY          Home Medications    Prior to Admission medications   Medication Sig Start Date End Date Taking? Authorizing Provider  acetaminophen (TYLENOL) 650 MG CR tablet Take 1,300 mg by mouth every 8 (eight) hours as needed for pain.    [provider]  COMBIGAN 0.2-0.5 % ophthalmic solution Place 1 drop into both eyes daily.  05/05/14   [provider]  LOTEMAX 0.5 % ophthalmic suspension Place 1 drop into both eyes daily.  05/05/14   [provider]  lovastatin (MEVACOR) 20 MG tablet Take 20 mg by mouth daily at 6 PM.  04/06/14   [provider]  polyethylene glycol-electrolytes (NULYTELY/GOLYTELY) 420 g solution Take 4,000 mLs by mouth as directed. 05/23/17   Rachael Fee, MD    Family History No family history on file.  Social History Social History   Tobacco Use  . Smoking status: Current Every Day Smoker    Packs/day: 0.60  .  Smokeless tobacco: Never Used  Substance Use Topics  . Alcohol use: No  . Drug use: Never     Allergies   Patient has no known allergies.   Review of Systems Review of Systems  Constitutional: Negative for fever.  HENT: Negative for congestion.   Respiratory: Negative for cough, shortness of breath and wheezing.   Gastrointestinal: Positive for abdominal distention. Negative for vomiting.  Genitourinary: Positive for difficulty urinating. Negative for flank pain.  All other systems reviewed and are negative.    Physical Exam Updated Vital Signs BP 136/70   Pulse 61   Temp 97.6 F (36.4 C) (Oral)   Resp 16   SpO2 97%   Physical Exam Vitals signs and nursing note reviewed.  Constitutional:      Appearance: Normal appearance.  HENT:     Head: Normocephalic and atraumatic.     Nose: Nose normal.     Mouth/Throat:     Mouth: Mucous membranes are moist.     Pharynx: Oropharynx is clear.  Eyes:     Conjunctiva/sclera: Conjunctivae normal.     Pupils: Pupils are equal, round, and reactive to light.  Neck:     Musculoskeletal: Normal range of motion and neck supple.  Cardiovascular:     Rate and Rhythm: Normal rate and regular rhythm.     Pulses: Normal pulses.     Heart sounds: Normal  heart sounds.  Pulmonary:     Effort: Pulmonary effort is normal.     Breath sounds: Normal breath sounds.  Abdominal:     General: There is distension.     Tenderness: There is no rebound.     Hernia: No hernia is present.  Musculoskeletal: Normal range of motion.  Skin:    General: Skin is warm and dry.     Capillary Refill: Capillary refill takes less than 2 seconds.  Neurological:     General: No focal deficit present.     Mental Status: He is alert.  Psychiatric:        Mood and Affect: Mood normal.        Behavior: Behavior normal.      ED Treatments / Results  Labs (all labs ordered are listed, but only abnormal results are displayed) Results for orders placed or  performed during the hospital encounter of 02/27/18  CBC with Differential/Platelet  Result Value Ref Range   WBC 7.0 4.0 - 10.5 K/uL   RBC 3.84 (L) 4.22 - 5.81 MIL/uL   Hemoglobin 11.9 (L) 13.0 - 17.0 g/dL   HCT 81.2 (L) 75.1 - 70.0 %   MCV 93.5 80.0 - 100.0 fL   MCH 31.0 26.0 - 34.0 pg   MCHC 33.1 30.0 - 36.0 g/dL   RDW 17.4 94.4 - 96.7 %   Platelets 189 150 - 400 K/uL   nRBC 0.0 0.0 - 0.2 %   Neutrophils Relative % 66 %   Neutro Abs 4.6 1.7 - 7.7 K/uL   Lymphocytes Relative 24 %   Lymphs Abs 1.7 0.7 - 4.0 K/uL   Monocytes Relative 8 %   Monocytes Absolute 0.6 0.1 - 1.0 K/uL   Eosinophils Relative 2 %   Eosinophils Absolute 0.1 0.0 - 0.5 K/uL   Basophils Relative 0 %   Basophils Absolute 0.0 0.0 - 0.1 K/uL   Immature Granulocytes 0 %   Abs Immature Granulocytes 0.01 0.00 - 0.07 K/uL  I-stat Creatinine, ED  Result Value Ref Range   Creatinine, Ser 0.80 0.61 - 1.24 mg/dL  POCT I-Stat EG7  Result Value Ref Range   pH, Ven 7.408 7.250 - 7.430   pCO2, Ven 41.4 (L) 44.0 - 60.0 mmHg   pO2, Ven 49.0 (H) 32.0 - 45.0 mmHg   Bicarbonate 26.1 20.0 - 28.0 mmol/L   TCO2 27 22 - 32 mmol/L   O2 Saturation 85.0 %   Acid-Base Excess 1.0 0.0 - 2.0 mmol/L   Sodium 139 135 - 145 mmol/L   Potassium 4.1 3.5 - 5.1 mmol/L   Calcium, Ion 1.14 (L) 1.15 - 1.40 mmol/L   HCT 35.0 (L) 39.0 - 52.0 %   Hemoglobin 11.9 (L) 13.0 - 17.0 g/dL   Patient temperature HIDE    Sample type VENOUS    No results found.  EKG None  Radiology No results found.  Procedures Procedures (including critical care time)  Medications Ordered in ED Medications - No data to display   Foley inserted with over 950 cc returned    Final Clinical Impressions(s) / ED Diagnoses  Follow up with urology for voiding trial in the office.  Leg bag placed.    Return for pain, intractable cough, fevers >100.4 unrelieved by medication, shortness of breath, intractable vomiting, chest pain, shortness of breath, weakness  numbness, changes in speech, facial asymmetry,abdominal pain, passing out,Inability to tolerate liquids or food, cough, altered mental status or any concerns. No signs of systemic illness  or infection. The patient is nontoxic-appearing on exam and vital signs are within normal limits.   I have reviewed the triage vital signs and the nursing notes. Pertinent labs &imaging results that were available during my care of the patient were reviewed by me and considered in my medical decision making (see chart for details).  After history, exam, and medical workup I feel the patient has been appropriately medically screened and is safe for discharge home. Pertinent diagnoses were discussed with the patient. Patient was given return precautions.   Francella Barnett, MD 02/28/18 [id number]

## 2019-12-16 ENCOUNTER — Ambulatory Visit: Payer: Medicare Other | Admitting: Gastroenterology

## 2022-02-13 ENCOUNTER — Encounter: Payer: Self-pay | Admitting: Gastroenterology

## 2022-03-08 ENCOUNTER — Ambulatory Visit (INDEPENDENT_AMBULATORY_CARE_PROVIDER_SITE_OTHER): Payer: 59 | Admitting: Gastroenterology

## 2022-03-08 ENCOUNTER — Encounter: Payer: Self-pay | Admitting: Gastroenterology

## 2022-03-08 VITALS — BP 110/60 | HR 70 | Ht 66.0 in | Wt 126.5 lb

## 2022-03-08 DIAGNOSIS — K5900 Constipation, unspecified: Secondary | ICD-10-CM | POA: Insufficient documentation

## 2022-03-08 DIAGNOSIS — R194 Change in bowel habit: Secondary | ICD-10-CM | POA: Diagnosis not present

## 2022-03-08 NOTE — Progress Notes (Signed)
03/08/2022 MARTESE HADERLIE [id number] Jul 14, 1940   HISTORY OF PRESENT ILLNESS: This is an 82 year old [place] male who was seen by Dr. Ardis Hughs back in 2019 and scheduled for colonoscopy for heme positive stool at that time.  Patient says that he did not proceed because he did not know his way around the area and did not have insurance at the time.  He is here today and would like to "check out his colon", aka have a colonoscopy.  He says that over the past year his bowel frequency has decreased.  He says he has a bowel movement about every 2 to 3 days.  Denies any rectal bleeding.  No other complaints.  Is drinking some laxative herbal type tea and that does help him go.  Patient is primarily Spanish-speaking so the entire visit was performed via interpreter/translator service.   Past Medical History:  Diagnosis Date   Anemia    Chronic low back pain    Elevated PSA    Hyperlipemia    Past Surgical History:  Procedure Laterality Date   BACK SURGERY      reports that he has been smoking. He has been smoking an average of .6 packs per day. He has never used smokeless tobacco. He reports that he does not drink alcohol and does not use drugs. family history is not on file. No Known Allergies    Outpatient Encounter Medications as of 03/08/2022  Medication Sig   COMBIGAN 0.2-0.5 % ophthalmic solution Place 1 drop into both eyes daily.    LOTEMAX 0.5 % ophthalmic suspension Place 1 drop into both eyes daily.    lovastatin (MEVACOR) 20 MG tablet Take 20 mg by mouth daily at 6 PM.    tamsulosin (FLOMAX) 0.4 MG CAPS capsule Take 1 capsule (0.4 mg total) by mouth daily.   polyethylene glycol-electrolytes (NULYTELY/GOLYTELY) 420 g solution Take 4,000 mLs by mouth as directed. (Patient not taking: Reported on 03/08/2022)   [DISCONTINUED] acetaminophen (TYLENOL) 650 MG CR tablet Take 1,300 mg by mouth every 8 (eight) hours as needed for pain.   No facility-administered encounter medications  on file as of 03/08/2022.     REVIEW OF SYSTEMS  : All other systems reviewed and negative except where noted in the History of Present Illness.   PHYSICAL EXAM: BP 110/60   Pulse 70   Ht 5' 6"$  (1.676 m)   Wt 126 lb 8 oz (57.4 kg)   BMI 20.42 kg/m  General: Well developed Asian male in no acute distress Head: Normocephalic and atraumatic Eyes:  Sclerae anicteric, conjunctiva pink. Ears: Normal auditory acuity Lungs: Clear throughout to auscultation; no W/R/R. Heart: Regular rate and rhythm; no M/R/G. Abdomen: Soft, non-distended.  BS present.  Non-tender. Rectal:  Will be done at the time of colonoscopy. Musculoskeletal: Symmetrical with no gross deformities  Skin: No lesions on visible extremities Extremities: No edema  Neurological: Alert oriented x 4, grossly non-focal Psychological:  Alert and cooperative. Normal mood and affect  ASSESSMENT AND PLAN: *Change in bowel habits with constipation: Having bowel movements about every 2 to 3 days.  This is a change over the past year.  Wants to "check out his colon" to make sure everything is okay.  Scheduled for colonoscopy in 2019, but says that he did not know his way around the area and did not have insurance at that time.  Would like to proceed now.  Will schedule with Dr.Nangidam.  In the interim he will plan to  start MiraLAX 1 capful mixed in 8 ounces of liquid daily.  The risks, benefits, and alternatives to colonoscopy were discussed with the patient and he consents to proceed.   CC:  Katherina Mires, MD

## 2022-03-08 NOTE — Patient Instructions (Addendum)
Start Miralax 1 capful daily in 8 ounces of liquid.   You have been scheduled for an endoscopy and colonoscopy. Please follow the written instructions given to you at your visit today. Please pick up your prep supplies at the pharmacy within the next 1-3 days. If you use inhalers (even only as needed), please bring them with you on the day of your procedure.  _______________________________________________________  If your blood pressure at your visit was 140/90 or greater, please contact your primary care physician to follow up on this.  _______________________________________________________  If you are age 30 or older, your body mass index should be between 23-30. Your Body mass index is 20.42 kg/m. If this is out of the aforementioned range listed, please consider follow up with your Primary Care Provider.  If you are age 19 or younger, your body mass index should be between 19-25. Your Body mass index is 20.42 kg/m. If this is out of the aformentioned range listed, please consider follow up with your Primary Care Provider.   ________________________________________________________  The [facility] providers would like to encourage you to use [facility] to communicate with providers for non-urgent requests or questions.  Due to long hold times on the telephone, sending your provider a message by [facility] may be a faster and more efficient way to get a response.  Please allow 48 business hours for a response.  Please remember that this is for non-urgent requests.  _______________________________________________________

## 2022-03-24 ENCOUNTER — Encounter: Payer: 59 | Admitting: Gastroenterology

## 2022-05-02 NOTE — Progress Notes (Signed)
Reviewed and agree with documentation and assessment and plan. K. Veena Sandralee Tarkington , MD
